# Patient Record
Sex: Female | Born: 1965 | Race: White | Hispanic: No | Smoking: Current every day smoker
Health system: Southern US, Community
[De-identification: ages and names within clinical notes are randomized; demographics above are authoritative.]

## PROBLEM LIST (undated history)

## (undated) DIAGNOSIS — F32A Depression, unspecified: Secondary | ICD-10-CM

## (undated) DIAGNOSIS — F419 Anxiety disorder, unspecified: Secondary | ICD-10-CM

## (undated) DIAGNOSIS — F329 Major depressive disorder, single episode, unspecified: Secondary | ICD-10-CM

## (undated) DIAGNOSIS — I671 Cerebral aneurysm, nonruptured: Secondary | ICD-10-CM

## (undated) HISTORY — DX: Anxiety disorder, unspecified: F41.9

## (undated) HISTORY — DX: Major depressive disorder, single episode, unspecified: F32.9

## (undated) HISTORY — DX: Cerebral aneurysm, nonruptured: I67.1

## (undated) HISTORY — DX: Depression, unspecified: F32.A

---

## 2011-12-15 DIAGNOSIS — I671 Cerebral aneurysm, nonruptured: Secondary | ICD-10-CM

## 2011-12-15 HISTORY — PX: CEREBRAL ANEURYSM REPAIR: SHX164

## 2011-12-15 HISTORY — DX: Cerebral aneurysm, nonruptured: I67.1

## 2014-06-20 ENCOUNTER — Telehealth: Payer: Self-pay | Admitting: Family Medicine

## 2014-06-20 NOTE — Telephone Encounter (Signed)
Patient has medicaid. Patient is on topamate, vistaril, celexa, trazadone, Ibuprofen as needed, patient just moved here in September. Patient is seeing [facility] on the 14th.  Patient states that she does not use any pain medications. Patient has used lorazepam in the past but has not used since September.  Appointment given for 07/20/14 with [facility].

## 2014-06-29 ENCOUNTER — Encounter (HOSPITAL_COMMUNITY): Payer: Self-pay | Admitting: Psychiatry

## 2014-06-29 ENCOUNTER — Encounter (INDEPENDENT_AMBULATORY_CARE_PROVIDER_SITE_OTHER): Payer: Self-pay

## 2014-06-29 ENCOUNTER — Ambulatory Visit (INDEPENDENT_AMBULATORY_CARE_PROVIDER_SITE_OTHER): Payer: Medicaid Other | Admitting: Psychiatry

## 2014-06-29 VITALS — BP 136/68 | HR 63 | Ht 66.0 in | Wt 179.0 lb

## 2014-06-29 DIAGNOSIS — F1021 Alcohol dependence, in remission: Secondary | ICD-10-CM

## 2014-06-29 DIAGNOSIS — F1490 Cocaine use, unspecified, uncomplicated: Secondary | ICD-10-CM

## 2014-06-29 DIAGNOSIS — F0630 Mood disorder due to known physiological condition, unspecified: Secondary | ICD-10-CM

## 2014-06-29 DIAGNOSIS — F39 Unspecified mood [affective] disorder: Secondary | ICD-10-CM

## 2014-06-29 DIAGNOSIS — F1421 Cocaine dependence, in remission: Secondary | ICD-10-CM

## 2014-06-29 DIAGNOSIS — F3130 Bipolar disorder, current episode depressed, mild or moderate severity, unspecified: Secondary | ICD-10-CM

## 2014-06-29 DIAGNOSIS — I671 Cerebral aneurysm, nonruptured: Secondary | ICD-10-CM | POA: Insufficient documentation

## 2014-06-29 DIAGNOSIS — F3162 Bipolar disorder, current episode mixed, moderate: Secondary | ICD-10-CM

## 2014-06-29 MED ORDER — TRAZODONE HCL 150 MG PO TABS: 75.0000 mg | ORAL_TABLET | Freq: Every day | ORAL | Status: DC

## 2014-06-29 MED ORDER — CITALOPRAM HYDROBROMIDE 20 MG PO TABS: 20.0000 mg | ORAL_TABLET | Freq: Every morning | ORAL | Status: DC

## 2014-06-29 MED ORDER — TOPIRAMATE 100 MG PO TABS: 150.0000 mg | ORAL_TABLET | Freq: Two times a day (BID) | ORAL | Status: DC

## 2014-06-29 MED ORDER — HYDROXYZINE PAMOATE 25 MG PO CAPS: 50.0000 mg | ORAL_CAPSULE | Freq: Two times a day (BID) | ORAL | Status: DC | PRN

## 2014-06-29 NOTE — Patient Instructions (Signed)
Support groups to attend Therapy. Avoid alcohol or other drugs
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
## 2014-07-20 ENCOUNTER — Encounter: Payer: Self-pay | Admitting: Family Medicine

## 2014-07-20 ENCOUNTER — Ambulatory Visit (INDEPENDENT_AMBULATORY_CARE_PROVIDER_SITE_OTHER): Payer: Medicaid Other | Admitting: Family Medicine

## 2014-07-20 VITALS — BP 123/66 | HR 88 | Temp 97.5°F | Ht 65.5 in | Wt 182.0 lb

## 2014-07-20 DIAGNOSIS — F3162 Bipolar disorder, current episode mixed, moderate: Secondary | ICD-10-CM | POA: Diagnosis not present

## 2014-07-20 DIAGNOSIS — I671 Cerebral aneurysm, nonruptured: Secondary | ICD-10-CM

## 2014-07-20 DIAGNOSIS — M2550 Pain in unspecified joint: Secondary | ICD-10-CM

## 2014-07-20 MED ORDER — VARENICLINE TARTRATE 0.5 MG X 11 & 1 MG X 42 PO MISC: ORAL | Status: DC

## 2014-07-20 NOTE — Progress Notes (Signed)
Subjective:    Patient ID: Tiffany Tyler, female    DOB: 07/01/1965, 49 y.o.   MRN: [id number]  HPI This is a new patient in today for the first time to establish care. She has a history of bipolar disease for which she has recently started seeing behavioral health the medications for that are as listed below citalopram hydroxyzine topiramate and trazodone. Today her primary concern is joint pain. This is widespread through many joints. She mentions pain and tenderness at the left elbow swelling at the hands and pain at the knees as well as lesser pains in other areas.  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 In 2013 she had a stent placed for that aneurysm however a new aneurysm behind the left eye was noted at that time on a scan. About that time she unfortunately went through the death of her 64 year old daughter through a heroin overdose. Unfortunately she was unable to pursue her own health care due to her despondency over the loss of her daughter. Today she is in for follow-up on the aneurysm as well. She is having headaches in the left temple and its associated with sensation of light in the eye. Patient states she has a tingling needlelike sensation all over body. Points to legs and the chest and arms. Notes bluish discoloration at the base of the thumbs.   Review of Systems  Constitutional: Negative for fever, chills, diaphoresis, appetite change, fatigue and unexpected weight change.  HENT: Negative for congestion, ear pain, hearing loss, postnasal drip, rhinorrhea, sneezing, sore throat and trouble swallowing.   Eyes: Negative for pain.  Respiratory: Negative for cough, chest tightness and shortness of breath.   Cardiovascular: Negative for chest pain and palpitations.  Gastrointestinal: Negative for nausea, vomiting, abdominal pain, diarrhea and constipation.  Genitourinary: Negative for dysuria,  frequency and menstrual problem.  Musculoskeletal: Negative for joint swelling and arthralgias.  Skin: Negative for rash.  Neurological:       See HPI  Psychiatric/Behavioral: Negative for dysphoric mood and agitation.       Objective:   Physical Exam  Constitutional: She is oriented to person, place, and time. She appears well-developed and well-nourished. No distress.  HENT:  Head: Normocephalic and atraumatic.  Right Ear: External ear normal.  Left Ear: External ear normal.  Nose: Nose normal.  Mouth/Throat: Oropharynx is clear and moist.  Eyes: Conjunctivae and EOM are normal. Pupils are equal, round, and reactive to light.  Neck: Normal range of motion. Neck supple. No thyromegaly present.  Cardiovascular: Normal rate, regular rhythm and normal heart sounds.   No murmur heard. Pulmonary/Chest: Effort normal and breath sounds normal. No respiratory distress. She has no wheezes. She has no rales.  Abdominal: Soft. Bowel sounds are normal. She exhibits no distension. There is no tenderness.  Lymphadenopathy:    She has no cervical adenopathy.  Neurological: She is alert and oriented to person, place, and time. She has normal reflexes.  Skin: Skin is warm and dry.  Psychiatric: She has a normal mood and affect. Her behavior is normal. Judgment and thought content normal.    BP 123/66 mmHg  Pulse 88  Temp(Src) 97.5 F (36.4 C) (Oral)  Ht 5' 5.5" (1.664 m)  Wt 182 lb (82.555 kg)  BMI 29.82 kg/m2  LMP 07/10/2014        Assessment & Plan:   1. Moderate mixed bipolar I  disorder   2. Brain aneurysm   3. Arthralgia     Meds ordered this encounter  Medications  . varenicline (CHANTIX STARTING MONTH PAK) 0.5 MG X 11 & 1 MG X 42 tablet    Sig: Take one 0.5 mg tablet by mouth once daily for 3 days, then increase to one 0.5 mg tablet twice daily for 4 days, then increase to one 1 mg tablet twice daily.    Dispense:  53 tablet    Refill:  0    Orders Placed This  Encounter  Procedures  . MR Brain W Wo Contrast    Standing Status: Future     Number of Occurrences:      Standing Expiration Date: 09/18/2015    Order Specific Question:  Reason for Exam (SYMPTOM  OR DIAGNOSIS REQUIRED)    Answer:  headaches and known brain aneurysm    Order Specific Question:  Preferred imaging location?    Answer:  [facility]    Order Specific Question:  Does the patient have a pacemaker or implanted devices?    Answer:  No    Order Specific Question:  What is the patient's sedation requirement?    Answer:  No Sedation  . CBC with Differential  . Sedimentation Rate  . RPR  . TSH  . ANA Comprehensive Panel  . Rheumatoid factor  . Comprehensive metabolic panel  . Lyme (B. burgdorferi) PCR  . HIV antibody (with reflex)    Labs pending Health Maintenance reviewed Diet and exercise encouraged Continue all meds as discussed Follow up in 1 mo.  Claretta Fraise, MD

## 2014-07-20 NOTE — Patient Instructions (Signed)
See mental health MD to consider further treatment options soon. Ask him regarding anonflict with using chantix. USe behavioral treatment such as suckers for the habit of hand to mouth activity involved with smoking.

## 2014-07-25 LAB — COMPREHENSIVE METABOLIC PANEL
ALBUMIN: 4.6 g/dL (ref 3.5–5.5)
ALT: 16 IU/L (ref 0–32)
AST: 24 IU/L (ref 0–40)
Albumin/Globulin Ratio: 2 (ref 1.1–2.5)
Alkaline Phosphatase: 65 IU/L (ref 39–117)
BILIRUBIN TOTAL: 0.4 mg/dL (ref 0.0–1.2)
BUN / CREAT RATIO: 11 (ref 9–23)
BUN: 10 mg/dL (ref 6–24)
CHLORIDE: 103 mmol/L (ref 97–108)
CO2: 21 mmol/L (ref 18–29)
Calcium: 9.7 mg/dL (ref 8.7–10.2)
Creatinine, Ser: 0.88 mg/dL (ref 0.57–1.00)
GFR calc Af Amer: 89 mL/min/{1.73_m2} (ref >59)
GFR calc non Af Amer: 77 mL/min/{1.73_m2} (ref >59)
Globulin, Total: 2.3 g/dL (ref 1.5–4.5)
Glucose: 93 mg/dL (ref 65–99)
Potassium: 4 mmol/L (ref 3.5–5.2)
Sodium: 139 mmol/L (ref 134–144)
Total Protein: 6.9 g/dL (ref 6.0–8.5)

## 2014-07-25 LAB — ANA COMPREHENSIVE PANEL
Centromere Ab Screen: <0.2 AI (ref 0.0–0.9)
Chromatin Ab SerPl-aCnc: <0.2 AI (ref 0.0–0.9)
ENA RNP Ab: 0.2 AI (ref 0.0–0.9)
ENA SSA (RO) Ab: <0.2 AI (ref 0.0–0.9)
ENA SSB (LA) Ab: <0.2 AI (ref 0.0–0.9)
dsDNA Ab: 1 IU/mL (ref 0–9)

## 2014-07-25 LAB — CBC WITH DIFFERENTIAL/PLATELET
Basophils Absolute: 0 10*3/uL (ref 0.0–0.2)
Basos: 1 %
EOS: 6 %
Eosinophils Absolute: 0.3 10*3/uL (ref 0.0–0.4)
HEMATOCRIT: 42.6 % (ref 34.0–46.6)
Hemoglobin: 14 g/dL (ref 11.1–15.9)
IMMATURE GRANULOCYTES: 0 %
Immature Grans (Abs): 0 10*3/uL (ref 0.0–0.1)
LYMPHS: 20 %
Lymphocytes Absolute: 1 10*3/uL (ref 0.7–3.1)
MCH: 30.4 pg (ref 26.6–33.0)
MCHC: 32.9 g/dL (ref 31.5–35.7)
MCV: 92 fL (ref 79–97)
MONOCYTES: 9 %
Monocytes Absolute: 0.5 10*3/uL (ref 0.1–0.9)
NEUTROS ABS: 3.3 10*3/uL (ref 1.4–7.0)
Neutrophils Relative %: 64 %
Platelets: 198 10*3/uL (ref 150–379)
RBC: 4.61 x10E6/uL (ref 3.77–5.28)
RDW: 13.5 % (ref 12.3–15.4)
WBC: 5.2 10*3/uL (ref 3.4–10.8)

## 2014-07-25 LAB — TSH: TSH: 0.759 u[IU]/mL (ref 0.450–4.500)

## 2014-07-25 LAB — RHEUMATOID FACTOR: Rhuematoid fact SerPl-aCnc: 10.7 IU/mL (ref 0.0–13.9)

## 2014-07-25 LAB — SEDIMENTATION RATE: Sed Rate: 4 mm/hr (ref 0–40)

## 2014-07-25 LAB — HIV ANTIBODY (ROUTINE TESTING W REFLEX): HIV Screen 4th Generation wRfx: NONREACTIVE

## 2014-07-25 LAB — LYME (B. BURGDORFERI) PCR: Lyme (B. burgdorferi) PCR: NEGATIVE

## 2014-07-25 LAB — RPR: RPR Ser Ql: NONREACTIVE

## 2014-07-26 ENCOUNTER — Ambulatory Visit (HOSPITAL_COMMUNITY): Payer: Medicaid Other | Admitting: Licensed Clinical Social Worker

## 2014-07-27 ENCOUNTER — Encounter: Payer: Self-pay | Admitting: Family Medicine

## 2014-07-31 ENCOUNTER — Ambulatory Visit (HOSPITAL_COMMUNITY): Payer: Medicaid Other | Admitting: Psychiatry

## 2014-07-31 ENCOUNTER — Other Ambulatory Visit (HOSPITAL_COMMUNITY): Payer: Self-pay | Admitting: Psychiatry

## 2014-08-01 ENCOUNTER — Ambulatory Visit: Payer: Medicaid Other | Admitting: Family Medicine

## 2014-08-03 ENCOUNTER — Encounter (HOSPITAL_COMMUNITY): Payer: Self-pay | Admitting: Psychiatry

## 2014-08-03 ENCOUNTER — Ambulatory Visit (INDEPENDENT_AMBULATORY_CARE_PROVIDER_SITE_OTHER): Payer: Medicaid Other | Admitting: Psychiatry

## 2014-08-03 ENCOUNTER — Encounter (INDEPENDENT_AMBULATORY_CARE_PROVIDER_SITE_OTHER): Payer: Self-pay

## 2014-08-03 VITALS — BP 145/99 | HR 83 | Ht 65.5 in | Wt 180.0 lb

## 2014-08-03 DIAGNOSIS — F0630 Mood disorder due to known physiological condition, unspecified: Secondary | ICD-10-CM

## 2014-08-03 DIAGNOSIS — F1021 Alcohol dependence, in remission: Secondary | ICD-10-CM

## 2014-08-03 DIAGNOSIS — F3162 Bipolar disorder, current episode mixed, moderate: Secondary | ICD-10-CM

## 2014-08-03 DIAGNOSIS — F1421 Cocaine dependence, in remission: Secondary | ICD-10-CM

## 2014-08-03 MED ORDER — TOPIRAMATE 100 MG PO TABS: 150.0000 mg | ORAL_TABLET | Freq: Two times a day (BID) | ORAL | Status: DC

## 2014-08-03 MED ORDER — ARIPIPRAZOLE 5 MG PO TABS: 5.0000 mg | ORAL_TABLET | Freq: Every day | ORAL | Status: DC

## 2014-08-03 MED ORDER — TRAZODONE HCL 50 MG PO TABS: 50.0000 mg | ORAL_TABLET | Freq: Every day | ORAL | Status: DC

## 2014-08-03 MED ORDER — HYDROXYZINE PAMOATE 25 MG PO CAPS: 25.0000 mg | ORAL_CAPSULE | Freq: Two times a day (BID) | ORAL | Status: DC | PRN

## 2014-08-03 MED ORDER — ARIPIPRAZOLE 10 MG PO TABS: 10.0000 mg | ORAL_TABLET | Freq: Every day | ORAL | Status: DC

## 2014-08-03 MED ORDER — CITALOPRAM HYDROBROMIDE 20 MG PO TABS: 20.0000 mg | ORAL_TABLET | Freq: Every morning | ORAL | Status: DC
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
## 2014-08-07 ENCOUNTER — Ambulatory Visit (INDEPENDENT_AMBULATORY_CARE_PROVIDER_SITE_OTHER): Payer: Medicaid Other | Admitting: Family Medicine

## 2014-08-11 ENCOUNTER — Ambulatory Visit (HOSPITAL_COMMUNITY)
Admission: RE | Admit: 2014-08-11 | Discharge: 2014-08-11 | Disposition: A | Discharge status: Home / Self Care | Payer: Medicaid Other | Source: Ambulatory Visit | Attending: Family Medicine | Admitting: Family Medicine

## 2014-08-11 DIAGNOSIS — I671 Cerebral aneurysm, nonruptured: Secondary | ICD-10-CM

## 2014-08-12 ENCOUNTER — Encounter (HOSPITAL_COMMUNITY): Payer: Self-pay | Admitting: Radiology

## 2014-08-15 ENCOUNTER — Ambulatory Visit (HOSPITAL_COMMUNITY)
Admission: RE | Admit: 2014-08-15 | Discharge: 2014-08-15 | Disposition: A | Discharge status: Home / Self Care | Payer: Medicaid Other | Source: Ambulatory Visit | Attending: Family Medicine | Admitting: Family Medicine

## 2014-08-17 ENCOUNTER — Other Ambulatory Visit (HOSPITAL_COMMUNITY): Payer: Self-pay | Admitting: Psychiatry

## 2014-08-21 ENCOUNTER — Ambulatory Visit: Payer: Medicaid Other | Admitting: Family Medicine

## 2014-08-24 ENCOUNTER — Other Ambulatory Visit: Payer: Medicaid Other | Admitting: Family

## 2014-08-30 ENCOUNTER — Ambulatory Visit (INDEPENDENT_AMBULATORY_CARE_PROVIDER_SITE_OTHER): Payer: MEDICAID | Admitting: Licensed Clinical Social Worker

## 2014-08-30 ENCOUNTER — Encounter (INDEPENDENT_AMBULATORY_CARE_PROVIDER_SITE_OTHER): Payer: Self-pay

## 2014-08-30 DIAGNOSIS — F329 Major depressive disorder, single episode, unspecified: Secondary | ICD-10-CM

## 2014-08-30 DIAGNOSIS — F3162 Bipolar disorder, current episode mixed, moderate: Secondary | ICD-10-CM
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
## 2014-09-01 ENCOUNTER — Ambulatory Visit (HOSPITAL_COMMUNITY): Payer: Medicaid Other | Admitting: Psychiatry

## 2014-09-11 ENCOUNTER — Telehealth: Payer: Self-pay | Admitting: Family Medicine

## 2014-09-11 NOTE — Telephone Encounter (Signed)
I would suggest giving her the phone number of the MRI place since the referrals already been made and having her reschedule for her convenience.

## 2014-09-11 NOTE — Telephone Encounter (Signed)
Please see attached note from patient  She had two appts for this test

## 2014-09-12 ENCOUNTER — Other Ambulatory Visit: Payer: Self-pay

## 2014-09-12 DIAGNOSIS — I729 Aneurysm of unspecified site: Secondary | ICD-10-CM

## 2014-09-13 ENCOUNTER — Ambulatory Visit (HOSPITAL_COMMUNITY): Payer: Self-pay | Admitting: Licensed Clinical Social Worker

## 2014-09-26 ENCOUNTER — Ambulatory Visit (HOSPITAL_COMMUNITY)
Admission: RE | Admit: 2014-09-26 | Discharge: 2014-09-26 | Disposition: A | Discharge status: Home / Self Care | Payer: Medicaid Other | Source: Ambulatory Visit | Attending: Family Medicine | Admitting: Family Medicine

## 2014-09-26 DIAGNOSIS — I729 Aneurysm of unspecified site: Secondary | ICD-10-CM

## 2014-09-26 DIAGNOSIS — H538 Other visual disturbances: Secondary | ICD-10-CM | POA: Insufficient documentation

## 2014-09-26 DIAGNOSIS — I671 Cerebral aneurysm, nonruptured: Secondary | ICD-10-CM | POA: Diagnosis not present

## 2014-09-26 MED ORDER — GADOBENATE DIMEGLUMINE 529 MG/ML IV SOLN: 15.0000 mL | Freq: Once | INTRAVENOUS | Status: AC | PRN

## 2014-09-27 ENCOUNTER — Telehealth: Payer: Self-pay | Admitting: Family Medicine

## 2014-09-28 ENCOUNTER — Ambulatory Visit (INDEPENDENT_AMBULATORY_CARE_PROVIDER_SITE_OTHER): Payer: MEDICAID | Admitting: Psychiatry

## 2014-09-28 ENCOUNTER — Encounter (HOSPITAL_COMMUNITY): Payer: Self-pay | Admitting: Psychiatry

## 2014-09-28 VITALS — HR 80 | Ht 65.0 in | Wt 180.0 lb

## 2014-09-28 DIAGNOSIS — F1021 Alcohol dependence, in remission: Secondary | ICD-10-CM

## 2014-09-28 DIAGNOSIS — F0630 Mood disorder due to known physiological condition, unspecified: Secondary | ICD-10-CM

## 2014-09-28 DIAGNOSIS — F39 Unspecified mood [affective] disorder: Secondary | ICD-10-CM

## 2014-09-28 DIAGNOSIS — F1421 Cocaine dependence, in remission: Secondary | ICD-10-CM | POA: Diagnosis not present

## 2014-09-28 DIAGNOSIS — F3162 Bipolar disorder, current episode mixed, moderate: Secondary | ICD-10-CM

## 2014-09-28 DIAGNOSIS — F3130 Bipolar disorder, current episode depressed, mild or moderate severity, unspecified: Secondary | ICD-10-CM | POA: Diagnosis not present

## 2014-09-28 MED ORDER — HYDROXYZINE PAMOATE 25 MG PO CAPS: 25.0000 mg | ORAL_CAPSULE | Freq: Two times a day (BID) | ORAL | Status: DC | PRN

## 2014-09-28 MED ORDER — CITALOPRAM HYDROBROMIDE 20 MG PO TABS: 20.0000 mg | ORAL_TABLET | Freq: Every morning | ORAL | Status: DC

## 2014-09-28 MED ORDER — TOPIRAMATE 100 MG PO TABS: 150.0000 mg | ORAL_TABLET | Freq: Two times a day (BID) | ORAL | Status: DC
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
## 2014-09-29 ENCOUNTER — Encounter: Payer: Self-pay | Admitting: Family Medicine

## 2014-09-29 ENCOUNTER — Ambulatory Visit (INDEPENDENT_AMBULATORY_CARE_PROVIDER_SITE_OTHER): Payer: Medicaid Other | Admitting: Family Medicine

## 2014-09-29 VITALS — BP 121/77 | HR 64 | Temp 97.5°F | Ht 65.0 in | Wt 181.2 lb

## 2014-09-29 DIAGNOSIS — M79601 Pain in right arm: Secondary | ICD-10-CM | POA: Diagnosis not present

## 2014-09-29 NOTE — Progress Notes (Signed)
Subjective:  Patient ID: Tiffany Tyler, female    DOB: [birth year]  Age: 49 y.o. MRN: [id number]  CC: Bleeding/Bruising   HPI Tiffany Tyler presents for an eruption on the right forearm that is somewhat reddish and purpuric that sprung up from no known injury a few days ago and has not resolved. There is no pain associated. There is no significant swelling. Patient is concerned that this could represent a blood clot. She has history of a brain aneurysm and is concerned this could represent a similar situation peripherally. Her mood disorder seems well controlled with citalopram currently. Says trazodone is helping with sleep and she needs to have that refilled. History Tiffany Tyler has a past medical history of Anxiety; Depression; and Aneurysm of anterior communicating artery (12/2011).   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   Her family history includes Alcohol abuse in her brother and maternal grandmother.She reports that she has been smoking.  She does not have any smokeless tobacco history on file. She reports that she does not drink alcohol or use illicit drugs.  Current Outpatient Prescriptions on File Prior to Visit  Medication Sig Dispense Refill  . citalopram (CELEXA) 20 MG tablet Take 1 tablet (20 mg total) by mouth every morning. 30 tablet 1  . hydrOXYzine (VISTARIL) 25 MG capsule Take 1 capsule (25 mg total) by mouth 2 (two) times daily as needed for anxiety. 30 capsule 0  . topiramate (TOPAMAX) 100 MG tablet Take 1.5 tablets (150 mg total) by mouth 2 (two) times daily. 60 tablet 1  . [DISCONTINUED] ARIPiprazole (ABILIFY) 5 MG tablet Take 1 tablet (5 mg total) by mouth daily. 30 tablet 0   No current facility-administered medications on file prior to visit.    ROS Review of Systems  Constitutional: Negative for fever, chills, diaphoresis, appetite change, fatigue and unexpected weight change.  HENT:  Negative for congestion, ear pain, hearing loss, postnasal drip, rhinorrhea, sneezing, sore throat and trouble swallowing.   Eyes: Negative for pain.  Respiratory: Negative for cough, chest tightness and shortness of breath.   Cardiovascular: Negative for chest pain and palpitations.  Gastrointestinal: Negative for nausea, vomiting, abdominal pain, diarrhea and constipation.  Genitourinary: Negative for dysuria, frequency and menstrual problem.  Musculoskeletal: Negative for joint swelling and arthralgias.  Skin: Negative for rash.  Neurological: Negative for dizziness, weakness, numbness and headaches.  Psychiatric/Behavioral: Negative for dysphoric mood and agitation.    Objective:  BP 121/77 mmHg  Pulse 64  Temp(Src) 97.5 F (36.4 C) (Oral)  Ht 5\' 5"  (1.651 m)  Wt 181 lb 3.2 oz (82.192 kg)  BMI 30.15 kg/m2  LMP 09/20/2014  BP Readings from Last 3 Encounters:  09/29/14 121/77  08/03/14 145/99  07/20/14 123/66    Wt Readings from Last 3 Encounters:  09/29/14 181 lb 3.2 oz (82.192 kg)  09/28/14 180 lb (81.647 kg)  08/03/14 180 lb (81.647 kg)     Physical Exam  Constitutional: She is oriented to person, place, and time. She appears well-developed and well-nourished. No distress.  HENT:  Head: Normocephalic and atraumatic.  Right Ear: External ear normal.  Left Ear: External ear normal.  Nose: Nose normal.  Mouth/Throat: Oropharynx is clear and moist.  Eyes: Conjunctivae and EOM are normal. Pupils are equal, round, and reactive to light.  Neck: Normal range of motion. Neck supple. No thyromegaly present.  Cardiovascular: Normal rate, regular rhythm and normal heart sounds.   No murmur heard. Pulmonary/Chest: Effort  normal and breath sounds normal. No respiratory distress. She has no wheezes. She has no rales.  Abdominal: Soft. Bowel sounds are normal. She exhibits no distension. There is no tenderness.  Lymphadenopathy:    She has no cervical adenopathy.    Neurological: She is alert and oriented to person, place, and time. She has normal reflexes.  Skin: Skin is warm and dry.  Psychiatric: She has a normal mood and affect. Her behavior is normal. Judgment and thought content normal.    No results found for: HGBA1C  Lab Results  Component Value Date   WBC 5.2 07/20/2014   HGB 14.0 07/20/2014   HCT 42.6 07/20/2014   PLT 198 07/20/2014   GLUCOSE 93 07/20/2014   ALT 16 07/20/2014   AST 24 07/20/2014   NA 139 07/20/2014   K 4.0 07/20/2014   CL 103 07/20/2014   CREATININE 0.88 07/20/2014   BUN 10 07/20/2014   CO2 21 07/20/2014   TSH 0.759 07/20/2014    Mr Brain W Wo Contrast  09/26/2014   CLINICAL DATA:  History of aneurysm.  Blurred vision.  EXAM: MRI HEAD WITHOUT AND WITH CONTRAST  TECHNIQUE: Multiplanar, multiecho pulse sequences of the brain and surrounding structures were obtained without and with intravenous contrast.  CONTRAST:  15 mL MultiHance IV  COMPARISON:  None.  FINDINGS: Ventricle size is normal. Cerebral volume is normal. Pituitary normal in size. Craniocervical junction normal.  Negative for acute ischemia. Patchy hyperintensity in the deep white matter of both cerebral hemispheres most consistent with chronic microvascular ischemia. No cortical infarct. Brainstem and cerebellum normal.  Negative for intracranial hemorrhage  Negative for mass or edema.  Normal enhancement following contrast infusion  History of cerebral aneurysm repair. No prior CT or records are available to determine the site of prior aneurysm. If there is concern of recurrent aneurysm, MRA or CTA recommended for further evaluation  Mucosal edema in the paranasal sinuses bilaterally. Right mastoid sinus effusion.  IMPRESSION: Chronic microvascular ischemic change in the cerebral white matter bilaterally. No acute abnormality.  History of prior aneurysm repair, not well visualized on MRI.   Electronically Signed   By: Franchot Gallo M.D.   On: 09/26/2014 14:12     Assessment & Plan:   Tiffany Tyler was seen today for bleeding/bruising.  Diagnoses and all orders for this visit:  Pain, arm, right Orders: -     Upper extremity Venous Duplex Right; Future  I have discontinued Tiffany Tyler's varenicline. I am also having her maintain her hydrOXYzine, citalopram, topiramate, and traZODone.  Meds ordered this encounter  Medications  . traZODone (DESYREL) 150 MG tablet    Sig: Take 1 tablet by mouth at bedtime as needed.    Refill:  0     Follow-up: Return in about 3 months (around 12/29/2014).  Claretta Fraise, M.D.

## 2014-10-09 ENCOUNTER — Other Ambulatory Visit: Payer: Medicaid Other | Admitting: Family

## 2014-10-30 ENCOUNTER — Ambulatory Visit (INDEPENDENT_AMBULATORY_CARE_PROVIDER_SITE_OTHER): Payer: MEDICAID | Admitting: Licensed Clinical Social Worker

## 2014-10-30 ENCOUNTER — Encounter (HOSPITAL_COMMUNITY): Payer: Self-pay | Admitting: *Deleted

## 2014-10-30 DIAGNOSIS — F319 Bipolar disorder, unspecified: Secondary | ICD-10-CM
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
## 2014-11-09 ENCOUNTER — Ambulatory Visit (INDEPENDENT_AMBULATORY_CARE_PROVIDER_SITE_OTHER): Payer: MEDICAID | Admitting: Licensed Clinical Social Worker

## 2014-11-09 DIAGNOSIS — F319 Bipolar disorder, unspecified: Secondary | ICD-10-CM

## 2014-11-10 DIAGNOSIS — F319 Bipolar disorder, unspecified: Secondary | ICD-10-CM | POA: Insufficient documentation
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
## 2014-11-15 ENCOUNTER — Telehealth: Payer: Self-pay

## 2014-11-15 NOTE — Telephone Encounter (Signed)
Suppose to get a referral for colonoscopy

## 2014-11-16 NOTE — Telephone Encounter (Signed)
Please refer as requested Thanks, WS 

## 2014-11-16 NOTE — Telephone Encounter (Signed)
Please refer is requested. Thanks, WS.

## 2014-11-17 ENCOUNTER — Ambulatory Visit (HOSPITAL_COMMUNITY): Payer: Self-pay | Admitting: Licensed Clinical Social Worker

## 2014-11-28 ENCOUNTER — Ambulatory Visit (INDEPENDENT_AMBULATORY_CARE_PROVIDER_SITE_OTHER): Payer: MEDICAID | Admitting: Psychiatry

## 2014-11-28 ENCOUNTER — Encounter (HOSPITAL_COMMUNITY): Payer: Self-pay | Admitting: Psychiatry

## 2014-11-28 VITALS — BP 126/70 | HR 60 | Ht 65.0 in | Wt 178.0 lb

## 2014-11-28 DIAGNOSIS — F1421 Cocaine dependence, in remission: Secondary | ICD-10-CM

## 2014-11-28 DIAGNOSIS — F3130 Bipolar disorder, current episode depressed, mild or moderate severity, unspecified: Secondary | ICD-10-CM

## 2014-11-28 DIAGNOSIS — F39 Unspecified mood [affective] disorder: Secondary | ICD-10-CM

## 2014-11-28 DIAGNOSIS — F1420 Cocaine dependence, uncomplicated: Secondary | ICD-10-CM | POA: Diagnosis not present

## 2014-11-28 DIAGNOSIS — F0630 Mood disorder due to known physiological condition, unspecified: Secondary | ICD-10-CM

## 2014-11-28 DIAGNOSIS — F1021 Alcohol dependence, in remission: Secondary | ICD-10-CM

## 2014-11-28 DIAGNOSIS — F319 Bipolar disorder, unspecified: Secondary | ICD-10-CM

## 2014-11-28 MED ORDER — TOPIRAMATE 100 MG PO TABS: 150.0000 mg | ORAL_TABLET | Freq: Two times a day (BID) | ORAL | Status: DC

## 2014-11-28 MED ORDER — CITALOPRAM HYDROBROMIDE 20 MG PO TABS: 20.0000 mg | ORAL_TABLET | Freq: Every morning | ORAL | Status: DC

## 2014-11-28 NOTE — Progress Notes (Signed)
Patient ID: Tiffany Tyler, female   DOB: September 19, 1965, 49 y.o.   MRN: [id number]  [name] Follow up Outpatient Visit  Tiffany Tyler [id number] 49 y.o.  11/28/2014 10:27 AM  Chief Complaint:  Medication management for bipolar. I had treatment. Recent relocation stressful.  History of Present Illness:   Patient Presents for follow up and medication management for Bipolar disorder and history of substance abuse.  Says that 2 years ago her daughter died of overdose on heroin since then she started regressing back on alcohol and has had some mood swings and uncertain episodes about depression having feelings of withdrawn, a motivation decrease in energy decreased sleep. She was feeling hopeless and helpless her psychiatrist started on Celexa at that time and that has balanced her out somewhat. She is not endorsing hopelessness helplessness or having any suicidal thoughts.  Last visit we stopped trazodone she is not taking Abilify. She kept taking Topamax that is helping her mood swings. She also is on Celexa for depression and anxiety. Recently she has lead to a DUI charge and she may have to do community service for 120 hours in the next 1 year. She is upset about it but says that she had to take a charge and she had a 78-year-old daughter sitting in the car and want to make the right decision here onwards.  Does not endorse significant craving for alcohol she attends church groups and is trying to rehabilitate and has been sober.  Modiyfing factors; her family  In regarding to her substance abuse she has remained sober off cocaine for the last 5 years sober off alcohol for the last 4 months. October 2015 she fell off the wagon had an accident but since then again sober.  At time she feels guilt about her daughter is dying of a heroin overdose but she is now realizing it is not her fault and that keeps her going moving forward. She is involved in church group and read  books and also that [place] books  There is no clear manic symptoms as now there is no paranoia or hallucinations or delusions as of now.  Does not endorse having panic like symptoms she does agree Mr. does help her anxiety and that her stress level goes up.    Hospitalization for psychiatric illness: Yes  Prior Suicide Attempts: No  Medical History; Past Medical History  Diagnosis Date  . Anxiety   . Depression   . 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 Lamotrigine Rash    Medications: Outpatient Encounter Prescriptions as of 11/28/2014  Medication Sig  . citalopram (CELEXA) 20 MG tablet Take 1 tablet (20 mg total) by mouth every morning.  . topiramate (TOPAMAX) 100 MG tablet Take 1.5 tablets (150 mg total) by mouth 2 (two) times daily.  . [DISCONTINUED] citalopram (CELEXA) 20 MG tablet Take 1 tablet (20 mg total) by mouth every morning.  . [DISCONTINUED] hydrOXYzine (VISTARIL) 25 MG capsule Take 1 capsule (25 mg total) by mouth 2 (two) times daily as needed for anxiety.  . [DISCONTINUED] topiramate (TOPAMAX) 100 MG tablet Take 1.5 tablets (150 mg total) by mouth 2 (two) times daily.  . [DISCONTINUED] traZODone (DESYREL) 150 MG tablet Take 1 tablet by mouth at bedtime as needed.   No facility-administered encounter medications on file as of 11/28/2014.     Substance Abuse History: Extensive history of using cocaine and alcohol.  She started smoking alcohol for the partner and that lasted for long she has had gone through multiple rehabs before last use of cocaine has been 5-6 years ago. No cravings she still has a sponsor and follows up with the church program   Alcohol she started drinking again 2 years ago when her daughter died. As of now she is not drinking sober for the last 3 months now significant craving. She follows up with her church program and also agrees to support  group pokes  Family History; Family History  Problem Relation Age of Onset  . Alcohol abuse Brother   . Alcohol abuse Maternal Grandmother         Musculoskeletal: Strength & Muscle Tone: within normal limits Gait & Station: normal Patient leans: N/A  Mental Status Examination;   Psychiatric Specialty Exam: Physical Exam  Constitutional: She appears well-developed and well-nourished. No distress.  HENT:  Head: Normocephalic and atraumatic.  Skin: She is not diaphoretic.    Review of Systems  Constitutional: Negative for fever.  Cardiovascular: Negative for chest pain.  Gastrointestinal: Negative for nausea.  Skin: Negative for rash.  Neurological: Negative for headaches.  Psychiatric/Behavioral: Negative for suicidal ideas and substance abuse. The patient is nervous/anxious.     Blood pressure 126/70, pulse 60, height 5\' 5"  (1.651 m), weight 178 lb (80.74 kg), SpO2 99 %.Body mass index is 29.62 kg/(m^2).  General Appearance: Casual  Eye Contact::  Fair  Speech:  Normal Rate  Volume:  Increased  Mood:  euthymic  Affect:  Congruent  Thought Process:  Coherent  Orientation:  Full (Time, Place, and Person)  Thought Content:  Rumination  Suicidal Thoughts:  No  Homicidal Thoughts:  No  Memory:  Immediate;   Fair Recent;   Fair  Judgement:  Fair  Insight:  Shallow  Psychomotor Activity:  Normal  Concentration:  Fair  Recall:  Fair  Akathisia:  Negative  Handed:  Right  AIMS (if indicated):     Assets:  Communication Skills Desire for Improvement Financial Resources/Insurance Physical Health Transportation Vocational/Educational  Sleep:        Assessment: Axis I: Bipolar disorder I, depressed type. 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 Mood disorder unspecified  Axis II: Deferred  Axis III:  Past Medical History  Diagnosis Date  . Anxiety   . Depression   . Aneurysm of anterior communicating artery  12/2011    embolization info scanned in 07/2014  Brain aneurysm  Axis IV: moderate, psycosocial. Relationship. Recent relocation. Doctors that   Treatment Plan and Summary:    Continue Celexa 20 mg continue Topamax 300 mg. For depression and bipolar.   Has remained sober off cocaine for many years now.  Continue attend AA groups and church groups for Alcohol use disorder   Pertinent Labs and Relevant Prior Notes reviewed. Medication Side effects, benefits and risks reviewed/discussed with Patient. Time given for patient to respond and asks questions regarding the Diagnosis and Medications. Safety concerns and to report to ER if suicidal or call 911. Relevant Medications refilled or called in to pharmacy.  Follow up with Primary care provider in regards to Medical conditions. Recommend compliance with medications and follow up office appointments. Discussed to avail opportunity to consider or/and continue Individual therapy with Counselor. Greater than 50% of time was spend in counseling and coordination of care with the patient.  Schedule for Follow up visit in 6 weeks  or call in earlier as necessary.  Merian Capron, MD 11/28/2014

## 2014-12-07 ENCOUNTER — Ambulatory Visit (INDEPENDENT_AMBULATORY_CARE_PROVIDER_SITE_OTHER): Payer: MEDICAID | Admitting: Licensed Clinical Social Worker

## 2014-12-07 DIAGNOSIS — F319 Bipolar disorder, unspecified: Secondary | ICD-10-CM | POA: Diagnosis not present

## 2014-12-07 NOTE — Progress Notes (Signed)
THERAPIST PROGRESS NOTE  Session Time: 1:00pm-2:00pm  Participation Level: Active  Behavioral Response: Casual Alert Anxious, Labile  Type of Therapy: Individual Therapy  Treatment Goals addressed: improve decision making skills  Interventions: Setting boundaries in relationships  Suicidal/Homicidal: Denied both  Therapist Interventions:  Helped patient consider pros and cons of working on her relationship with her husband.  Expressed concern that doing so would make it more difficult for her to focus on her own self-care.  Provided suggestions for how she can be supportive of her husband getting help for his own mental health issues, but make it clear to him that she is not going to "save him."        Summary:  Indicated that she has been struggling to make decisions lately.  Says she would like to figure out a way to serve her 120 day sentence without interrupting the positive ways she is working on her recovery.  She wants to continue going to church, work, go to school, and participate in therapy.   Indicated she is unsure about what to do about her relationship with her husband.  Reports he has started going to therapy and she has observed positive changes in the way he is expressing his thoughts and feelings.  Wants to believe that he is capable of change.      Plan: May introduce mindfulness at next appointment.   Diagnosis:Bipolar I disorder unspecified

## 2014-12-22 ENCOUNTER — Ambulatory Visit (HOSPITAL_COMMUNITY): Payer: Self-pay | Admitting: Licensed Clinical Social Worker

## 2015-01-05 ENCOUNTER — Ambulatory Visit (HOSPITAL_COMMUNITY): Payer: Self-pay | Admitting: Licensed Clinical Social Worker

## 2015-01-18 ENCOUNTER — Ambulatory Visit (HOSPITAL_COMMUNITY): Payer: Self-pay | Admitting: Licensed Clinical Social Worker

## 2015-01-24 ENCOUNTER — Ambulatory Visit (HOSPITAL_COMMUNITY): Payer: Self-pay | Admitting: Psychiatry

## 2015-02-13 ENCOUNTER — Other Ambulatory Visit (HOSPITAL_COMMUNITY): Payer: Self-pay | Admitting: Psychiatry

## 2015-02-15 NOTE — Telephone Encounter (Signed)
Received medication request from [facility] for Celexa 20mg . Per Dr. De Nurse, pt medication request for Celexa 20mg  is denied. Pt was dismissed from clinic on 01/24/15 due to no show policy.

## 2015-02-27 ENCOUNTER — Ambulatory Visit (INDEPENDENT_AMBULATORY_CARE_PROVIDER_SITE_OTHER): Payer: Medicaid Other

## 2015-02-27 ENCOUNTER — Encounter: Payer: Self-pay | Admitting: Nurse Practitioner

## 2015-02-27 ENCOUNTER — Ambulatory Visit (INDEPENDENT_AMBULATORY_CARE_PROVIDER_SITE_OTHER): Payer: Medicaid Other | Admitting: Nurse Practitioner

## 2015-02-27 VITALS — BP 127/77 | HR 57 | Temp 96.7°F | Ht 65.0 in | Wt 175.0 lb

## 2015-02-27 DIAGNOSIS — K219 Gastro-esophageal reflux disease without esophagitis: Secondary | ICD-10-CM | POA: Diagnosis not present

## 2015-02-27 DIAGNOSIS — F329 Major depressive disorder, single episode, unspecified: Secondary | ICD-10-CM

## 2015-02-27 DIAGNOSIS — R1013 Epigastric pain: Secondary | ICD-10-CM | POA: Diagnosis not present

## 2015-02-27 DIAGNOSIS — F32A Depression, unspecified: Secondary | ICD-10-CM

## 2015-02-27 DIAGNOSIS — K5900 Constipation, unspecified: Secondary | ICD-10-CM

## 2015-02-27 MED ORDER — OMEPRAZOLE 20 MG PO CPDR: 20.0000 mg | DELAYED_RELEASE_CAPSULE | Freq: Every day | ORAL | Status: DC

## 2015-02-27 MED ORDER — CITALOPRAM HYDROBROMIDE 40 MG PO TABS: 40.0000 mg | ORAL_TABLET | Freq: Every day | ORAL | Status: DC

## 2015-02-27 NOTE — Progress Notes (Signed)
   Subjective:    Patient ID: Tiffany Tyler, female    DOB: 03-27-1966, 49 y.o.   MRN: [id number]  HPI Patient in today c/o: - Has been nauseated for last 2 weeks with scattered abdominal pain- She wakes up feeling this way and worsens after she eats. She also started her menses but usually does not cause problems with her stomach. - Says that this abdominal problems has caused depression. See depression screen. She is currently on celexa and topamax and has been on this combination for awhile.    Review of Systems  Constitutional: Negative.   HENT: Negative.   Respiratory: Negative.   Cardiovascular: Negative.   Gastrointestinal: Positive for nausea, abdominal pain and diarrhea (but that has stopped). Negative for constipation.  Genitourinary: Positive for pelvic pain. Negative for dysuria, urgency and frequency.  Neurological: Negative.   Psychiatric/Behavioral: Negative.   All other systems reviewed and are negative.      Objective:   Physical Exam  Constitutional: She is oriented to person, place, and time. She appears well-developed and well-nourished.  Cardiovascular: Normal rate, regular rhythm and normal heart sounds.   Pulmonary/Chest: Effort normal and breath sounds normal.  Abdominal: Soft. Bowel sounds are normal. She exhibits no distension and no mass. There is tenderness (mid epigastric pain on palpation). There is no rebound and no guarding.  Neurological: She is alert and oriented to person, place, and time.  Skin: Skin is warm and dry.  Psychiatric: She has a normal mood and affect. Her behavior is normal. Judgment and thought content normal.    BP 127/77 mmHg  Pulse 57  Temp(Src) 96.7 F (35.9 C) (Oral)  Ht 5\' 5"  (1.651 m)  Wt 175 lb (79.379 kg)  BMI 29.12 kg/m2  KUB- moderate stool burden-Preliminary reading by Ronnald Collum, FNP  [facility]       Assessment & Plan:  1. Epigastric pain - DG Abd 1 View; Future  2. Constipation, unspecified  constipation type miralax daily  Increase fiber in diet  3. Gastroesophageal reflux disease without esophagitis Avoid spicy foods Do not eat 2 hours prior to bedtime\ - omeprazole (PRILOSEC) 20 MG capsule; Take 1 capsule (20 mg total) by mouth daily.  Dispense: 30 capsule; Refill: 3  4. Depression Stress management Continue counseling - citalopram (CELEXA) 40 MG tablet; Take 1 tablet (40 mg total) by mouth daily.  Dispense: 30 tablet; Refill: [place], FNP

## 2015-02-27 NOTE — Patient Instructions (Signed)

## 2015-05-21 ENCOUNTER — Telehealth: Payer: Self-pay | Admitting: Family Medicine

## 2015-05-23 ENCOUNTER — Ambulatory Visit: Payer: Medicaid Other

## 2015-05-24 ENCOUNTER — Encounter: Payer: Self-pay | Admitting: Family Medicine

## 2015-05-24 ENCOUNTER — Ambulatory Visit (INDEPENDENT_AMBULATORY_CARE_PROVIDER_SITE_OTHER): Payer: Medicaid Other | Admitting: Family Medicine

## 2015-05-24 VITALS — BP 117/71 | HR 72 | Temp 99.2°F | Ht 65.0 in | Wt 173.0 lb

## 2015-05-24 DIAGNOSIS — H6591 Unspecified nonsuppurative otitis media, right ear: Secondary | ICD-10-CM

## 2015-05-24 DIAGNOSIS — Z1211 Encounter for screening for malignant neoplasm of colon: Secondary | ICD-10-CM | POA: Diagnosis not present

## 2015-05-24 MED ORDER — FLUTICASONE PROPIONATE 50 MCG/ACT NA SUSP: 1.0000 | Freq: Two times a day (BID) | NASAL | Status: DC | PRN

## 2015-05-24 MED ORDER — AZITHROMYCIN 250 MG PO TABS: ORAL_TABLET | ORAL | Status: DC

## 2015-05-24 NOTE — Progress Notes (Signed)
BP 117/71 mmHg  Pulse 72  Temp(Src) 99.2 F (37.3 C) (Oral)  Ht 5\' 5"  (1.651 m)  Wt 173 lb (78.472 kg)  BMI 28.79 kg/m2  LMP  (Approximate)   Subjective:    Patient ID: Tiffany Tyler, female    DOB: May 08, 1966, 49 y.o.   MRN: [id number]  HPI: Tiffany Tyler is a 49 y.o. female presenting on 05/24/2015 for Cough; Right ear pain; and Chest congestion   HPI Right ear pain and nasal congestion Patient is been having right ear pain and nasal congestion and sinus pressure and postnasal drainage along with a dry cough for the past 3 days. She denies fevers or chills. She does admit that she has started back up smoking. Her right ear has also been popping along with the pain. There is no drainage from her right ear. She denies any sick contacts. The pain radiates down into her right jaw as well. She has tried some over-the-counter Tylenol and an antihistamine.  Relevant past medical, surgical, family and social history reviewed and updated as indicated. Interim medical history since our last visit reviewed. Allergies and medications reviewed and updated.  Review of Systems  Constitutional: Negative for fever and chills.  HENT: Positive for congestion, ear pain, postnasal drip, rhinorrhea, sinus pressure and sore throat. Negative for ear discharge and sneezing.   Eyes: Negative for pain, redness and visual disturbance.  Respiratory: Positive for cough. Negative for chest tightness and shortness of breath.   Cardiovascular: Negative for chest pain and leg swelling.  Genitourinary: Negative for dysuria and difficulty urinating.  Musculoskeletal: Negative for back pain and gait problem.  Skin: Negative for rash.  Neurological: Negative for light-headedness and headaches.  Psychiatric/Behavioral: Negative for behavioral problems and agitation.  All other systems reviewed and are negative.   Per HPI unless specifically indicated above     Medication List       This list is  accurate as of: 05/24/15  2:58 PM.  Always use your most recent med list.               azithromycin 250 MG tablet  Commonly known as:  ZITHROMAX  Take 2 the first day and then one each day after.     citalopram 40 MG tablet  Commonly known as:  CELEXA  Take 1 tablet (40 mg total) by mouth daily.     ferrous sulfate 325 (65 FE) MG tablet  Take 325 mg by mouth daily with breakfast.     fluticasone 50 MCG/ACT nasal spray  Commonly known as:  FLONASE  Place 1 spray into both nostrils 2 (two) times daily as needed for allergies or rhinitis.     omeprazole 20 MG capsule  Commonly known as:  PRILOSEC  Take 1 capsule (20 mg total) by mouth daily.     topiramate 100 MG tablet  Commonly known as:  TOPAMAX  Take 1.5 tablets (150 mg total) by mouth 2 (two) times daily.           Objective:    BP 117/71 mmHg  Pulse 72  Temp(Src) 99.2 F (37.3 C) (Oral)  Ht 5\' 5"  (1.651 m)  Wt 173 lb (78.472 kg)  BMI 28.79 kg/m2  LMP  (Approximate)  Wt Readings from Last 3 Encounters:  05/24/15 173 lb (78.472 kg)  02/27/15 175 lb (79.379 kg)  11/28/14 178 lb (80.74 kg)    Physical Exam  Constitutional: She is oriented to person, place, and time. She appears well-developed and  well-nourished. No distress.  HENT:  Right Ear: External ear and ear canal normal. No mastoid tenderness. Tympanic membrane is injected, erythematous and bulging. Tympanic membrane is not scarred and not perforated. A middle ear effusion is present.  Left Ear: Tympanic membrane, external ear and ear canal normal.  Nose: Mucosal edema and rhinorrhea present. No epistaxis. Right sinus exhibits no maxillary sinus tenderness and no frontal sinus tenderness. Left sinus exhibits no maxillary sinus tenderness and no frontal sinus tenderness.  Mouth/Throat: Uvula is midline and mucous membranes are normal. Posterior oropharyngeal edema and posterior oropharyngeal erythema present. No oropharyngeal exudate or tonsillar abscesses.    Eyes: Conjunctivae and EOM are normal.  Cardiovascular: Normal rate, regular rhythm, normal heart sounds and intact distal pulses.   No murmur heard. Pulmonary/Chest: Effort normal and breath sounds normal. No respiratory distress. She has no wheezes.  Musculoskeletal: Normal range of motion. She exhibits no edema or tenderness.  Neurological: She is alert and oriented to person, place, and time. Coordination normal.  Skin: Skin is warm and dry. No rash noted. She is not diaphoretic.  Psychiatric: She has a normal mood and affect. Her behavior is normal.  Vitals reviewed.   Results for orders placed or performed in visit on 07/20/14  CBC with Differential  Result Value Ref Range   WBC 5.2 3.4 - 10.8 x10E3/uL   RBC 4.61 3.77 - 5.28 x10E6/uL   Hemoglobin 14.0 11.1 - 15.9 g/dL   HCT 42.6 34.0 - 46.6 %   MCV 92 79 - 97 fL   MCH 30.4 26.6 - 33.0 pg   MCHC 32.9 31.5 - 35.7 g/dL   RDW 13.5 12.3 - 15.4 %   Platelets 198 150 - 379 x10E3/uL   Neutrophils Relative % 64 %   Lymphs 20 %   Monocytes 9 %   Eos 6 %   Basos 1 %   Neutrophils Absolute 3.3 1.4 - 7.0 x10E3/uL   Lymphocytes Absolute 1.0 0.7 - 3.1 x10E3/uL   Monocytes Absolute 0.5 0.1 - 0.9 x10E3/uL   Eosinophils Absolute 0.3 0.0 - 0.4 x10E3/uL   Basophils Absolute 0.0 0.0 - 0.2 x10E3/uL   Immature Granulocytes 0 %   Immature Grans (Abs) 0.0 0.0 - 0.1 x10E3/uL  Sedimentation Rate  Result Value Ref Range   Sed Rate 4 0 - 40 mm/hr  RPR  Result Value Ref Range   RPR Ser Ql Non Reactive Non Reactive  TSH  Result Value Ref Range   TSH 0.759 0.450 - 4.500 uIU/mL  ANA Comprehensive Panel  Result Value Ref Range   dsDNA Ab 1 0 - 9 IU/mL   ENA RNP Ab 0.2 0.0 - 0.9 AI   ENA SM Ab Ser-aCnc <0.2 0.0 - 0.9 AI   Scleroderma SCL-70 <0.2 0.0 - 0.9 AI   ENA SSA (RO) Ab <0.2 0.0 - 0.9 AI   ENA SSB (LA) Ab <0.2 0.0 - 0.9 AI   Chromatin Ab SerPl-aCnc <0.2 0.0 - 0.9 AI   Anti JO-1 <0.2 0.0 - 0.9 AI   Centromere Ab Screen <0.2 0.0 - 0.9  AI   See below: Comment   Rheumatoid factor  Result Value Ref Range   Rhuematoid fact SerPl-aCnc 10.7 0.0 - 13.9 IU/mL  Comprehensive metabolic panel  Result Value Ref Range   Glucose 93 65 - 99 mg/dL   BUN 10 6 - 24 mg/dL   Creatinine, Ser 0.88 0.57 - 1.00 mg/dL   GFR calc non Af Amer 77 >59 mL/min/1.73  GFR calc Af Amer 89 >59 mL/min/1.73   BUN/Creatinine Ratio 11 9 - 23   Sodium 139 134 - 144 mmol/L   Potassium 4.0 3.5 - 5.2 mmol/L   Chloride 103 97 - 108 mmol/L   CO2 21 18 - 29 mmol/L   Calcium 9.7 8.7 - 10.2 mg/dL   Total Protein 6.9 6.0 - 8.5 g/dL   Albumin 4.6 3.5 - 5.5 g/dL   Globulin, Total 2.3 1.5 - 4.5 g/dL   Albumin/Globulin Ratio 2.0 1.1 - 2.5   Bilirubin Total 0.4 0.0 - 1.2 mg/dL   Alkaline Phosphatase 65 39 - 117 IU/L   AST 24 0 - 40 IU/L   ALT 16 0 - 32 IU/L  Lyme (B. burgdorferi) PCR  Result Value Ref Range   Lyme (B. burgdorferi) PCR Negative Negative  HIV antibody (with reflex)  Result Value Ref Range   HIV Screen 4th Generation wRfx Non Reactive Non Reactive      Assessment & Plan:   Problem List Items Addressed This Visit    None    Visit Diagnoses    Encounter for screening colonoscopy    -  Primary    Relevant Orders    Ambulatory referral to Gastroenterology    Right otitis media with effusion        Relevant Medications    azithromycin (ZITHROMAX) 250 MG tablet    fluticasone (FLONASE) 50 MCG/ACT nasal spray        Follow up plan: Return if symptoms worsen or fail to improve.  Counseling provided for all of the vaccine components Orders Placed This Encounter  Procedures  . Ambulatory referral to Gastroenterology    Caryl Pina, MD [facility] 05/24/2015, 2:58 PM

## 2015-05-25 ENCOUNTER — Encounter (INDEPENDENT_AMBULATORY_CARE_PROVIDER_SITE_OTHER): Payer: Self-pay | Admitting: *Deleted

## 2015-06-26 ENCOUNTER — Other Ambulatory Visit: Payer: Self-pay

## 2015-06-26 MED ORDER — HYDROXYZINE HCL 25 MG PO TABS: ORAL_TABLET | ORAL | Status: DC

## 2015-06-26 NOTE — Telephone Encounter (Signed)
Last seen 05/24/15  Dr Dettinger  This med not on EPIC list  Sent from pharmacy

## 2015-06-29 ENCOUNTER — Other Ambulatory Visit: Payer: Self-pay | Admitting: Family Medicine

## 2015-06-29 MED ORDER — HYDROXYZINE HCL 25 MG PO TABS: ORAL_TABLET | ORAL | Status: DC

## 2015-07-02 ENCOUNTER — Telehealth: Payer: Self-pay | Admitting: *Deleted

## 2015-07-02 NOTE — Telephone Encounter (Signed)
Script for Atarax called in.

## 2015-08-06 ENCOUNTER — Encounter: Payer: Self-pay | Admitting: Family Medicine

## 2015-08-06 ENCOUNTER — Ambulatory Visit (INDEPENDENT_AMBULATORY_CARE_PROVIDER_SITE_OTHER): Payer: Medicaid Other | Admitting: Family Medicine

## 2015-08-06 VITALS — BP 126/69 | HR 70 | Temp 97.0°F | Ht 65.0 in | Wt 160.8 lb

## 2015-08-06 DIAGNOSIS — S0003XA Contusion of scalp, initial encounter: Secondary | ICD-10-CM

## 2015-08-06 NOTE — Progress Notes (Signed)
Subjective:  Patient ID: Tiffany Tyler, female    DOB: 1965-08-09  Age: 50 y.o. MRN: [id number]  CC: Head Laceration   HPI Tiffany Tyler presents for hit her head on a mantle piece that she stood up last night. It caused her to fall over but did not pass out. She says it's just because it caught her off guard. However it is sore and painful. It bled some last night that stopped spontaneously after several minutes. Additionally it bled for a short time this morning. She is concerned about exposure at work.   History Tiffany Tyler has a past medical history of Anxiety; Depression; and Aneurysm of anterior communicating artery (12/2011).   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   Her family history includes Alcohol abuse in her brother and maternal grandmother.She reports that she has been smoking.  She does not have any smokeless tobacco history on file. She reports that she does not drink alcohol or use illicit drugs.    ROS Review of Systems  Constitutional: Negative for fever, diaphoresis, activity change, appetite change and fatigue.  Gastrointestinal: Negative for nausea and vomiting.  Neurological: Negative for seizures, syncope, weakness, numbness and headaches.  Psychiatric/Behavioral: Negative for agitation.    Objective:  BP 126/69 mmHg  Pulse 70  Temp(Src) 97 F (36.1 C) (Oral)  Ht 5\' 5"  (1.651 m)  Wt 160 lb 12.8 oz (72.938 kg)  BMI 26.76 kg/m2  SpO2 97%  BP Readings from Last 3 Encounters:  08/06/15 126/69  05/24/15 117/71  02/27/15 127/77    Wt Readings from Last 3 Encounters:  08/06/15 160 lb 12.8 oz (72.938 kg)  05/24/15 173 lb (78.472 kg)  02/27/15 175 lb (79.379 kg)     Physical Exam  Constitutional: She is oriented to person, place, and time. She appears well-developed and well-nourished.  Neurological: She is alert and oriented to person, place, and time.  Skin: Skin is warm and  dry.  There is a first-degree laceration 6 mm in length by 2 mm. This is in the epidermis only. There is minimal serosanguineous discharge. No signs of infection including redness swelling or pus. It is sore to palpation.  Psychiatric: Her behavior is normal.     Lab Results  Component Value Date   WBC 5.2 07/20/2014   HGB 14.0 07/20/2014   HCT 42.6 07/20/2014   PLT 198 07/20/2014   GLUCOSE 93 07/20/2014   ALT 16 07/20/2014   AST 24 07/20/2014   NA 139 07/20/2014   K 4.0 07/20/2014   CL 103 07/20/2014   CREATININE 0.88 07/20/2014   BUN 10 07/20/2014   CO2 21 07/20/2014   TSH 0.759 07/20/2014    Mr Brain W Wo Contrast  09/26/2014  CLINICAL DATA:  History of aneurysm.  Blurred vision. EXAM: MRI HEAD WITHOUT AND WITH CONTRAST TECHNIQUE: Multiplanar, multiecho pulse sequences of the brain and surrounding structures were obtained without and with intravenous contrast. CONTRAST:  15 mL MultiHance IV COMPARISON:  None. FINDINGS: Ventricle size is normal. Cerebral volume is normal. Pituitary normal in size. Craniocervical junction normal. Negative for acute ischemia. Patchy hyperintensity in the deep white matter of both cerebral hemispheres most consistent with chronic microvascular ischemia. No cortical infarct. Brainstem and cerebellum normal. Negative for intracranial hemorrhage Negative for mass or edema. Normal enhancement following contrast infusion History of cerebral aneurysm repair. No prior CT or records are available to determine the site of prior aneurysm. If there is concern  of recurrent aneurysm, MRA or CTA recommended for further evaluation Mucosal edema in the paranasal sinuses bilaterally. Right mastoid sinus effusion. IMPRESSION: Chronic microvascular ischemic change in the cerebral white matter bilaterally. No acute abnormality. History of prior aneurysm repair, not well visualized on MRI. Electronically Signed   By: Franchot Gallo M.D.   On: 09/26/2014 14:12    Assessment &  Plan:   [name] was seen today for head laceration.  Diagnoses and all orders for this visit:  Scalp contusion, initial encounter   Wound care reviewed. Signs and symptoms of infection. Laceration care handout printed  I have discontinued [name]'s azithromycin. I am also having her maintain her topiramate, ferrous sulfate, omeprazole, citalopram, fluticasone, and hydrOXYzine.  No orders of the defined types were placed in this encounter.     Follow-up: Return if symptoms worsen or fail to improve. Follow up immediately for nausea vomiting or intense headache.  Claretta Fraise, M.D.

## 2015-08-06 NOTE — Patient Instructions (Signed)

## 2015-09-06 ENCOUNTER — Encounter (INDEPENDENT_AMBULATORY_CARE_PROVIDER_SITE_OTHER): Payer: Self-pay | Admitting: *Deleted

## 2015-09-07 ENCOUNTER — Other Ambulatory Visit (INDEPENDENT_AMBULATORY_CARE_PROVIDER_SITE_OTHER): Payer: Self-pay | Admitting: *Deleted

## 2015-09-07 DIAGNOSIS — Z1211 Encounter for screening for malignant neoplasm of colon: Secondary | ICD-10-CM

## 2015-09-07 DIAGNOSIS — Z800 Family history of malignant neoplasm of digestive organs: Secondary | ICD-10-CM

## 2015-09-11 ENCOUNTER — Other Ambulatory Visit: Payer: Self-pay | Admitting: Nurse Practitioner

## 2015-09-21 ENCOUNTER — Other Ambulatory Visit (INDEPENDENT_AMBULATORY_CARE_PROVIDER_SITE_OTHER): Payer: Self-pay | Admitting: *Deleted

## 2015-09-21 NOTE — Telephone Encounter (Signed)
trilyte

## 2015-09-24 MED ORDER — PEG 3350-KCL-NA BICARB-NACL 420 G PO SOLR: 4000.0000 mL | Freq: Once | ORAL | Status: DC

## 2015-10-05 ENCOUNTER — Telehealth (INDEPENDENT_AMBULATORY_CARE_PROVIDER_SITE_OTHER): Payer: Self-pay | Admitting: *Deleted

## 2015-10-05 NOTE — Telephone Encounter (Signed)
Referring MD/PCP: stacks -- [facility]   Procedure: tcs  Reason/Indication:  Screening, fam hx colon ca  Has patient had this procedure before?  no  If so, when, by whom and where?    Is there a family history of colon cancer?  Yes, mother  Who?  What age when diagnosed?    Is patient diabetic?   no      Does patient have prosthetic heart valve or mechanical valve?  no  Do you have a pacemaker?  no  Has patient ever had endocarditis? no  Has patient had joint replacement within last 12 months?  no  Does patient tend to be constipated or take laxatives? no  Does patient have a history of alcohol/drug use?  no  Is patient on Coumadin, Plavix and/or Aspirin? no  Medications: celexa 40 mg daily, iron 325 mg daily, omeprazole 20 mg daily, topiramate  Allergies: see epic  Medication Adjustment: iron 10 days  Procedure date & time: 11/01/15 at 1200

## 2015-10-09 NOTE — Telephone Encounter (Signed)
agree

## 2015-12-19 ENCOUNTER — Encounter (INDEPENDENT_AMBULATORY_CARE_PROVIDER_SITE_OTHER): Payer: Self-pay | Admitting: *Deleted

## 2015-12-21 ENCOUNTER — Encounter: Payer: Self-pay | Admitting: Physician Assistant

## 2015-12-21 ENCOUNTER — Ambulatory Visit (INDEPENDENT_AMBULATORY_CARE_PROVIDER_SITE_OTHER): Payer: Medicaid Other | Admitting: Physician Assistant

## 2015-12-21 VITALS — BP 124/84 | HR 80 | Temp 97.4°F | Ht 65.0 in | Wt 162.4 lb

## 2015-12-21 DIAGNOSIS — S5012XA Contusion of left forearm, initial encounter: Secondary | ICD-10-CM

## 2015-12-21 DIAGNOSIS — S5011XA Contusion of right forearm, initial encounter: Secondary | ICD-10-CM

## 2015-12-21 MED ORDER — IBUPROFEN 600 MG PO TABS: 600.0000 mg | ORAL_TABLET | Freq: Three times a day (TID) | ORAL | Status: DC | PRN

## 2015-12-21 NOTE — Patient Instructions (Signed)

## 2015-12-21 NOTE — Progress Notes (Signed)
Subjective:     Patient ID: Tiffany Tyler, female   DOB: April 10, 1966, 50 y.o.   MRN: [id number]  HPI Pt with some red areas to the lower L arm  Review of Systems No pain or swelling No drainage No meds for the sx    Objective:   Physical Exam Several area of superficial bruising to the L mid forearm area No surrounding edema, erythema, or induration No TTP FROM elbow/wrist No skin breakdown noted    Assessment:     1. Contusion of lower arm, left, initial encounter        Plan:     Nl course reviewed with pt Activity as tol She also wanted a rx for Ibuprofen for intermit LBP  This has worked well in the past Ibuprofen rx done  F/U prn

## 2016-01-02 ENCOUNTER — Telehealth (INDEPENDENT_AMBULATORY_CARE_PROVIDER_SITE_OTHER): Payer: Self-pay | Admitting: *Deleted

## 2016-01-02 NOTE — Telephone Encounter (Signed)
agree

## 2016-01-02 NOTE — Telephone Encounter (Signed)
Referring MD/PCP: stacks--[facility]   Procedure: tcs  Reason/Indication:  Screening, fam hx colon ca  Has patient had this procedure before?  no  If so, when, by whom and where?    Is there a family history of colon cancer?  Yes, mother  Who?  What age when diagnosed?    Is patient diabetic?   no      Does patient have prosthetic heart valve or mechanical valve?  no  Do you have a pacemaker?  no  Has patient ever had endocarditis? no  Has patient had joint replacement within last 12 months?  no  Does patient tend to be constipated or take laxatives? o  Does patient have a history of alcohol/drug use?  no  Is patient on Coumadin, Plavix and/or Aspirin? no  Medications: celexa 40 mg daily, iron 325 mg daily, omeprazole 20 mg daily,   Allergies: see epic  Medication Adjustment: iron 10 days  Procedure date & time: 01/31/16 at 930

## 2016-01-03 ENCOUNTER — Other Ambulatory Visit: Payer: Medicaid Other | Admitting: Pediatrics

## 2016-01-04 ENCOUNTER — Encounter: Payer: Self-pay | Admitting: Family Medicine

## 2016-01-07 ENCOUNTER — Telehealth: Payer: Self-pay | Admitting: Family Medicine

## 2016-01-16 ENCOUNTER — Other Ambulatory Visit: Payer: Medicaid Other | Admitting: Pediatrics

## 2016-01-23 ENCOUNTER — Other Ambulatory Visit: Payer: Medicaid Other | Admitting: Pediatrics

## 2016-01-24 ENCOUNTER — Encounter: Payer: Self-pay | Admitting: Family Medicine

## 2016-01-31 SURGERY — COLONOSCOPY
Anesthesia: Moderate Sedation

## 2016-02-01 ENCOUNTER — Ambulatory Visit (HOSPITAL_COMMUNITY): Admission: RE | Admit: 2016-02-01 | Payer: Medicaid Other | Source: Ambulatory Visit | Admitting: Internal Medicine

## 2016-05-07 ENCOUNTER — Other Ambulatory Visit: Payer: Self-pay | Admitting: Nurse Practitioner

## 2016-05-21 ENCOUNTER — Other Ambulatory Visit: Payer: Medicaid Other | Admitting: Pediatrics

## 2016-06-26 ENCOUNTER — Other Ambulatory Visit: Payer: Self-pay | Admitting: Family Medicine

## 2016-07-23 ENCOUNTER — Encounter: Payer: Self-pay | Admitting: Family Medicine

## 2016-07-23 ENCOUNTER — Ambulatory Visit (INDEPENDENT_AMBULATORY_CARE_PROVIDER_SITE_OTHER): Payer: Medicaid Other | Admitting: Family Medicine

## 2016-07-23 VITALS — BP 109/64 | HR 60 | Temp 98.1°F | Ht 65.0 in | Wt 175.0 lb

## 2016-07-23 DIAGNOSIS — Z0000 Encounter for general adult medical examination without abnormal findings: Secondary | ICD-10-CM | POA: Diagnosis not present

## 2016-07-23 DIAGNOSIS — Z124 Encounter for screening for malignant neoplasm of cervix: Secondary | ICD-10-CM

## 2016-07-23 DIAGNOSIS — Z202 Contact with and (suspected) exposure to infections with a predominantly sexual mode of transmission: Secondary | ICD-10-CM

## 2016-07-23 DIAGNOSIS — Z01411 Encounter for gynecological examination (general) (routine) with abnormal findings: Secondary | ICD-10-CM

## 2016-07-23 DIAGNOSIS — Z1211 Encounter for screening for malignant neoplasm of colon: Secondary | ICD-10-CM

## 2016-07-23 LAB — MICROSCOPIC EXAMINATION: RENAL EPITHEL UA: NONE SEEN /HPF

## 2016-07-23 LAB — URINALYSIS, COMPLETE
Bilirubin, UA: NEGATIVE
Glucose, UA: NEGATIVE
KETONES UA: NEGATIVE
Nitrite, UA: NEGATIVE
PH UA: 7 (ref 5.0–7.5)
PROTEIN UA: NEGATIVE
RBC, UA: NEGATIVE
SPEC GRAV UA: 1.01 (ref 1.005–1.030)
Urobilinogen, Ur: 0.2 mg/dL (ref 0.2–1.0)

## 2016-07-23 MED ORDER — FERROUS SULFATE 325 (65 FE) MG PO TABS: 325.0000 mg | ORAL_TABLET | Freq: Every day | ORAL | 5 refills | Status: AC

## 2016-07-23 MED ORDER — CITALOPRAM HYDROBROMIDE 40 MG PO TABS: 40.0000 mg | ORAL_TABLET | Freq: Every day | ORAL | 5 refills | Status: DC

## 2016-07-23 MED ORDER — IBUPROFEN 600 MG PO TABS: 600.0000 mg | ORAL_TABLET | Freq: Three times a day (TID) | ORAL | 0 refills | Status: AC | PRN

## 2016-07-23 MED ORDER — HYDROXYZINE HCL 25 MG PO TABS: ORAL_TABLET | ORAL | 5 refills | Status: AC

## 2016-07-23 MED ORDER — TOPIRAMATE 100 MG PO TABS: 150.0000 mg | ORAL_TABLET | Freq: Two times a day (BID) | ORAL | 2 refills | Status: DC
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
## 2016-07-24 LAB — CBC WITH DIFFERENTIAL/PLATELET
Basophils Absolute: 0 10*3/uL (ref 0.0–0.2)
Basos: 0 %
EOS (ABSOLUTE): 0.4 10*3/uL (ref 0.0–0.4)
Eos: 8 %
HEMATOCRIT: 41.7 % (ref 34.0–46.6)
HEMOGLOBIN: 13.7 g/dL (ref 11.1–15.9)
IMMATURE GRANULOCYTES: 0 %
Immature Grans (Abs): 0 10*3/uL (ref 0.0–0.1)
LYMPHS ABS: 1.6 10*3/uL (ref 0.7–3.1)
Lymphs: 32 %
MCH: 30.4 pg (ref 26.6–33.0)
MCHC: 32.9 g/dL (ref 31.5–35.7)
MCV: 93 fL (ref 79–97)
MONOCYTES: 5 %
Monocytes Absolute: 0.2 10*3/uL (ref 0.1–0.9)
NEUTROS PCT: 55 %
Neutrophils Absolute: 2.9 10*3/uL (ref 1.4–7.0)
Platelets: 184 10*3/uL (ref 150–379)
RBC: 4.5 x10E6/uL (ref 3.77–5.28)
RDW: 13.5 % (ref 12.3–15.4)
WBC: 5.1 10*3/uL (ref 3.4–10.8)

## 2016-07-24 LAB — LIPID PANEL
CHOL/HDL RATIO: 2.7 ratio (ref 0.0–4.4)
Cholesterol, Total: 189 mg/dL (ref 100–199)
HDL: 71 mg/dL (ref >39)
LDL CALC: 98 mg/dL (ref 0–99)
Triglycerides: 99 mg/dL (ref 0–149)
VLDL Cholesterol Cal: 20 mg/dL (ref 5–40)

## 2016-07-24 LAB — CMP14+EGFR
ALBUMIN: 4.1 g/dL (ref 3.5–5.5)
ALT: 15 IU/L (ref 0–32)
AST: 21 IU/L (ref 0–40)
Albumin/Globulin Ratio: 1.8 (ref 1.2–2.2)
Alkaline Phosphatase: 66 IU/L (ref 39–117)
BUN / CREAT RATIO: 15 (ref 9–23)
BUN: 14 mg/dL (ref 6–24)
Bilirubin Total: 0.3 mg/dL (ref 0.0–1.2)
CO2: 24 mmol/L (ref 18–29)
CREATININE: 0.91 mg/dL (ref 0.57–1.00)
Calcium: 9 mg/dL (ref 8.7–10.2)
Chloride: 107 mmol/L — ABNORMAL HIGH (ref 96–106)
GFR calc Af Amer: 84 mL/min/{1.73_m2} (ref >59)
GFR, EST NON AFRICAN AMERICAN: 73 mL/min/{1.73_m2} (ref >59)
GLOBULIN, TOTAL: 2.3 g/dL (ref 1.5–4.5)
Glucose: 70 mg/dL (ref 65–99)
Potassium: 3.5 mmol/L (ref 3.5–5.2)
SODIUM: 144 mmol/L (ref 134–144)
Total Protein: 6.4 g/dL (ref 6.0–8.5)

## 2016-07-24 LAB — RPR: RPR Ser Ql: NONREACTIVE

## 2016-07-24 LAB — TSH: TSH: 0.948 u[IU]/mL (ref 0.450–4.500)

## 2016-07-24 LAB — HEPATITIS C ANTIBODY

## 2016-07-24 LAB — HIV ANTIBODY (ROUTINE TESTING W REFLEX): HIV Screen 4th Generation wRfx: NONREACTIVE

## 2016-07-25 LAB — PAP IG, CT-NG NAA, HPV HIGH-RISK
CHLAMYDIA, NUC. ACID AMP: NEGATIVE
GONOCOCCUS BY NUCLEIC ACID AMP: NEGATIVE
HPV, high-risk: NEGATIVE
PAP Smear Comment: 0

## 2016-07-26 ENCOUNTER — Other Ambulatory Visit: Payer: Self-pay | Admitting: Family Medicine

## 2016-07-26 MED ORDER — METRONIDAZOLE 500 MG PO TABS: 2000.0000 mg | ORAL_TABLET | Freq: Once | ORAL | 0 refills | Status: AC

## 2016-07-26 MED ORDER — AMOXICILLIN-POT CLAVULANATE 875-125 MG PO TABS: 1.0000 | ORAL_TABLET | Freq: Two times a day (BID) | ORAL | 0 refills | Status: AC

## 2016-07-29 ENCOUNTER — Encounter (INDEPENDENT_AMBULATORY_CARE_PROVIDER_SITE_OTHER): Payer: Self-pay | Admitting: *Deleted

## 2016-10-03 ENCOUNTER — Ambulatory Visit: Payer: Medicaid Other | Admitting: Family Medicine

## 2016-10-06 ENCOUNTER — Encounter: Payer: Self-pay | Admitting: Family Medicine

## 2016-12-10 ENCOUNTER — Encounter: Payer: Medicaid Other | Admitting: *Deleted

## 2017-01-22 DIAGNOSIS — Z171 Estrogen receptor negative status [ER-]: Secondary | ICD-10-CM

## 2017-01-22 DIAGNOSIS — C50811 Malignant neoplasm of overlapping sites of right female breast: Secondary | ICD-10-CM | POA: Insufficient documentation

## 2017-03-21 IMAGING — MR MR HEAD WO/W CM
10 of 12 series · 34 of 48 positions shown · IV contrast (multihance)
Comparison: None.

CLINICAL DATA: History of aneurysm.  Blurred vision.

EXAM:
MRI HEAD WITHOUT AND WITH CONTRAST
TECHNIQUE: Multiplanar, multiecho pulse sequences of the brain and surrounding
structures were obtained without and with intravenous contrast.
CONTRAST:  15 mL MultiHance IV

[Series 3: T1 · sagittal · 5.0mm · 0.47mm/px · 3 of 23 slices shown]
[im 1/23]
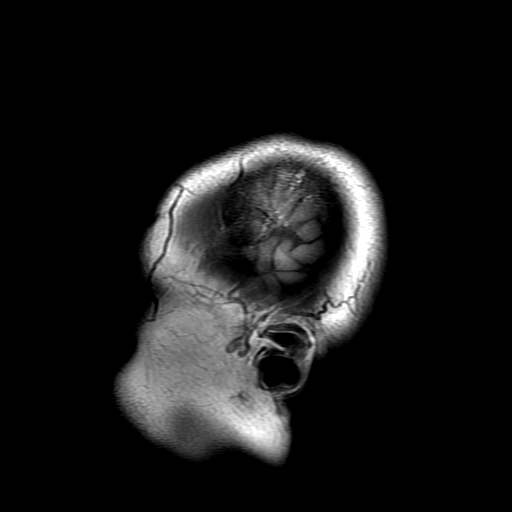
[im 12/23]
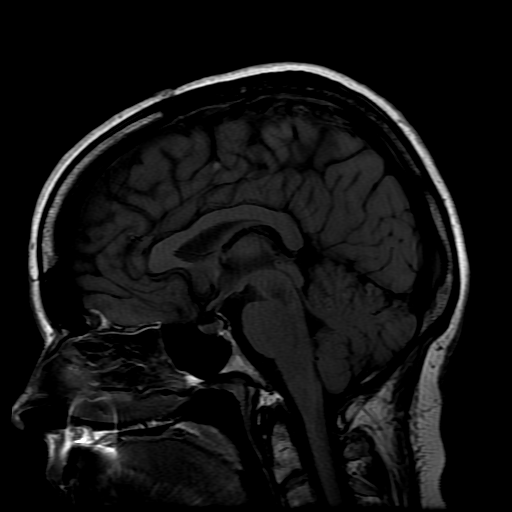
[im 23/23]
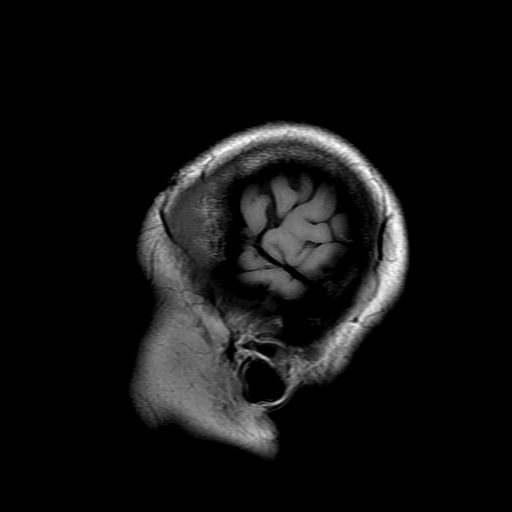

[Series 4: DWI · axial · 3.0mm · 1.09mm/px · z∈[-60,+82]mm · 9 of 98 slices shown (1 of 4)]
[im 1/98]
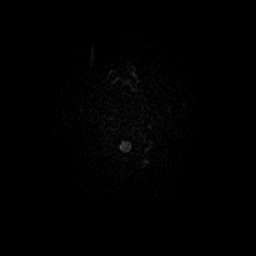
[im 13/98]
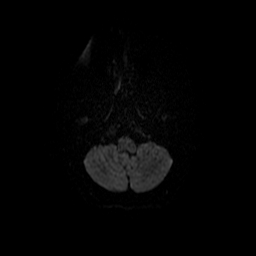
[im 25/98]
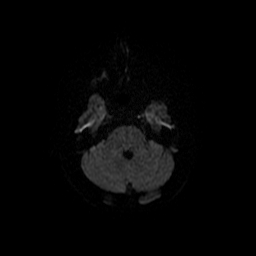
[im 37/98]
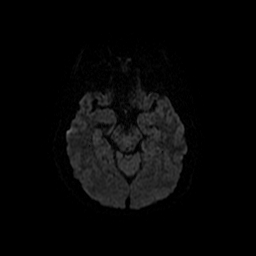
[im 49/98]
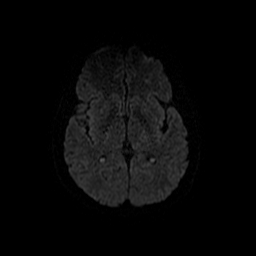
[im 61/98]
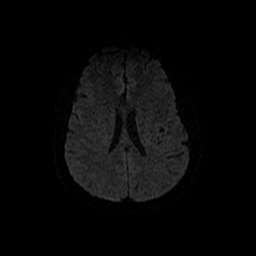
[im 73/98]
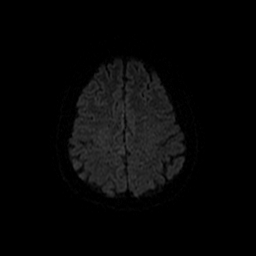
[im 85/98]
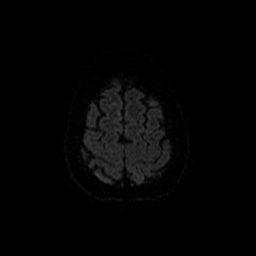
[im 98/98]
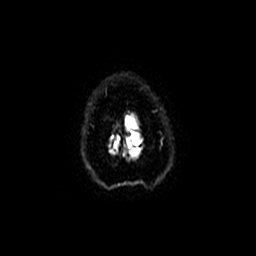

[Series 5: T2 · axial · 5.0mm · 0.43mm/px · z∈[-62,+92]mm · 2 of 27 slices shown]
[im 1/27]
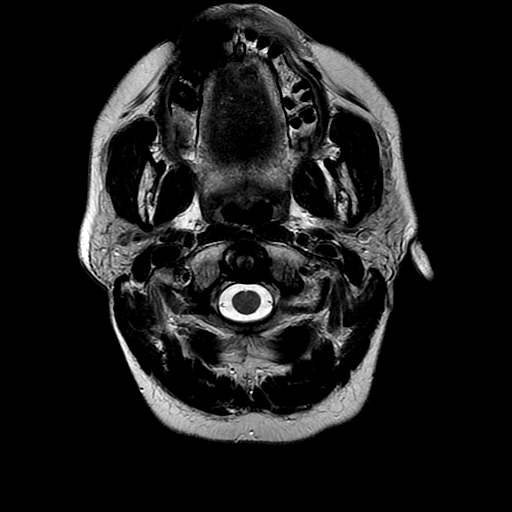
[im 27/27]
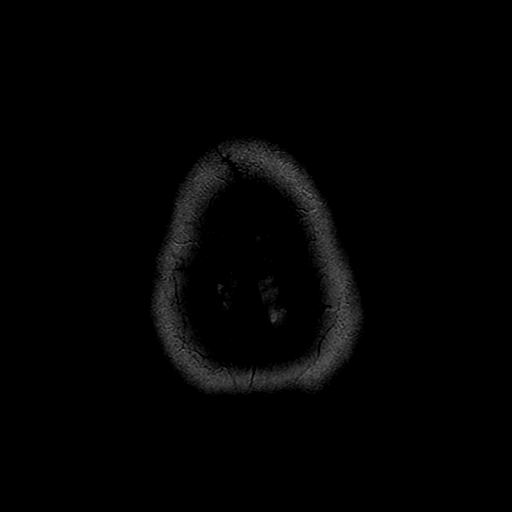

[Series 6: DWI · coronal · 5.0mm · 1.09mm/px · 6 of 70 slices shown (2 of 4)]
[im 1/70]
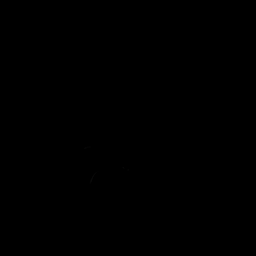
[im 14/70]
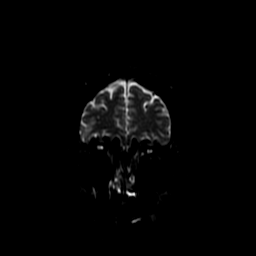
[im 28/70]
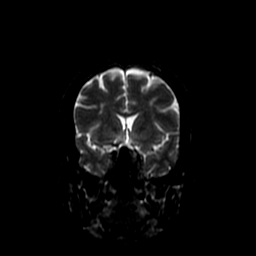
[im 42/70]
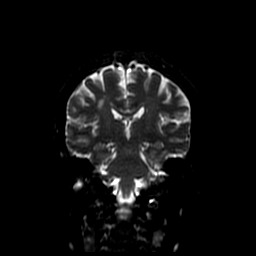
[im 56/70]
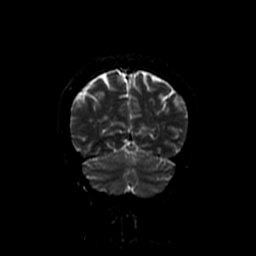
[im 70/70]
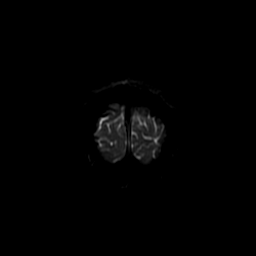

[Series 7: FLAIR · axial · 5.0mm · 0.43mm/px · z∈[-67,+84]mm · 2 of 23 slices shown]
[im 1/23]
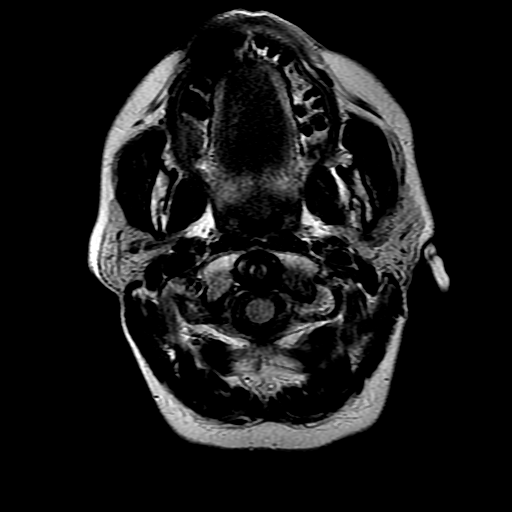
[im 23/23]
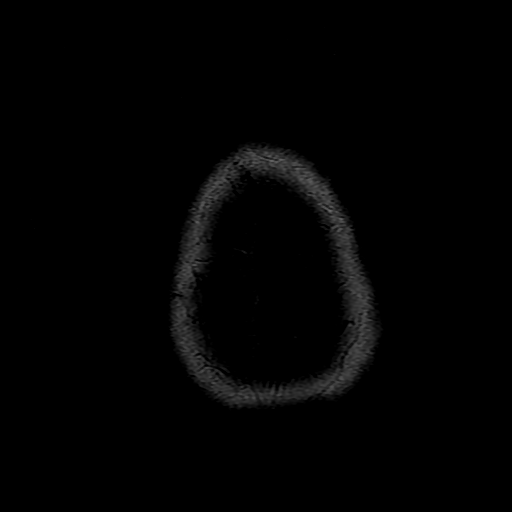

[Series 8: ax mpgr · axial · 5.0mm · 0.45mm/px · 1 of 23 slices shown]
[im 1/23]
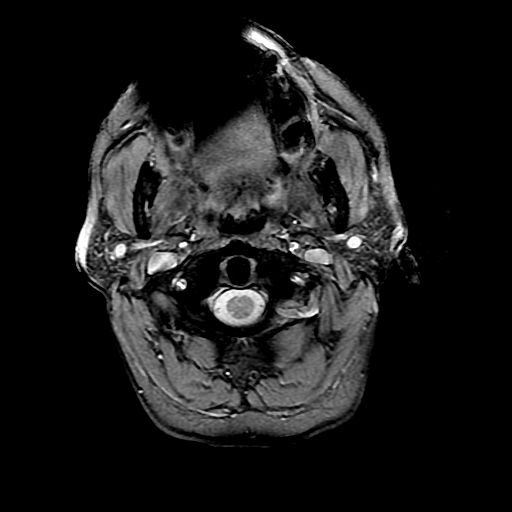

[Series 11: T2 post-contrast · coronal · 5.0mm · 0.39mm/px · 2 of 27 slices shown]
[im 1/27]
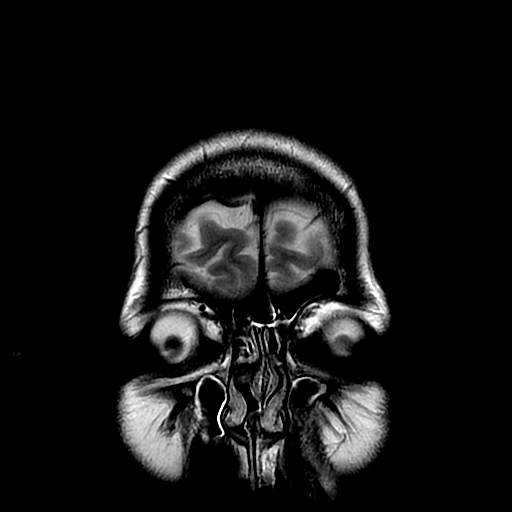
[im 27/27]
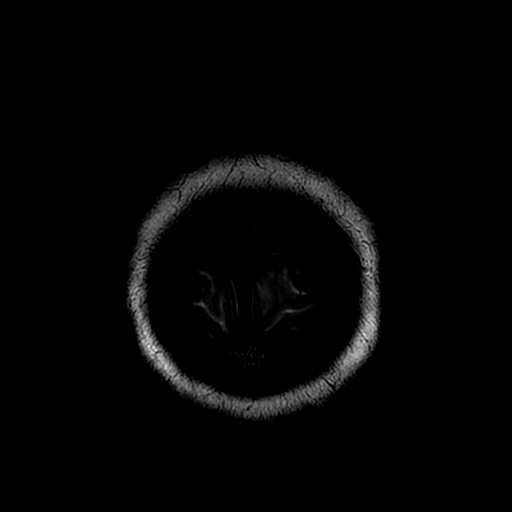

[Series 13: T1 post-contrast · coronal · 5.0mm · 0.39mm/px · 2 of 27 slices shown]
[im 1/27]
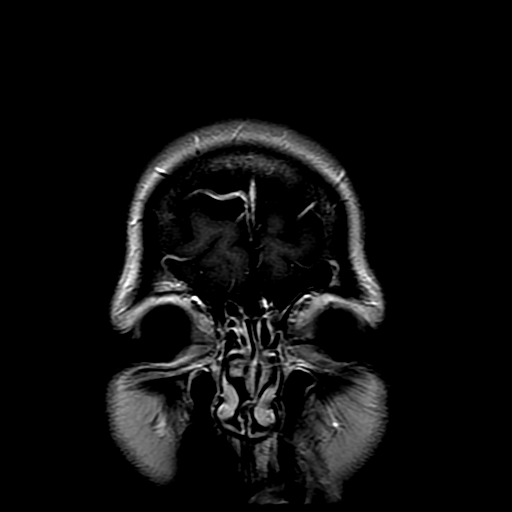
[im 27/27]
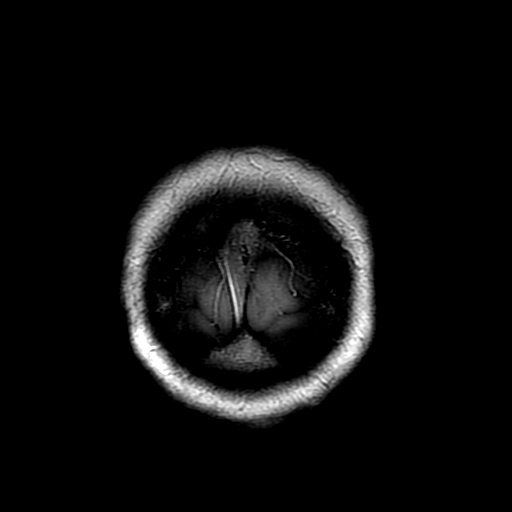

[Series 400: DWI · axial · 3.0mm · 1.09mm/px · z∈[-60,+82]mm · 4 of 49 slices shown (3 of 4)]
[im 1/49]
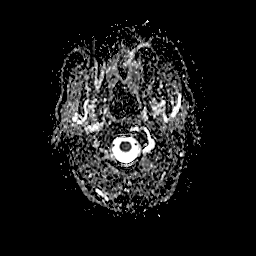
[im 17/49]
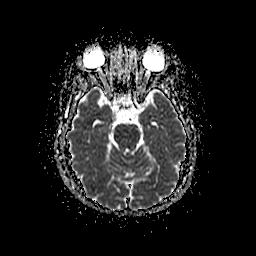
[im 33/49]
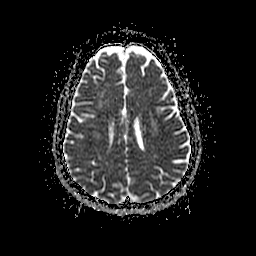
[im 49/49]
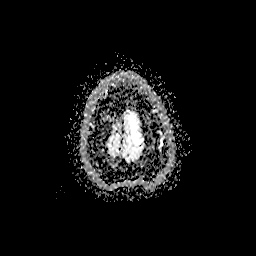

[Series 600: DWI · coronal · 5.0mm · 1.09mm/px · 3 of 35 slices shown (4 of 4)]
[im 1/35]
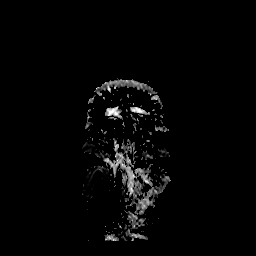
[im 18/35]
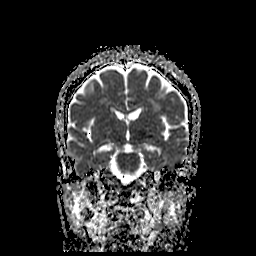
[im 35/35]
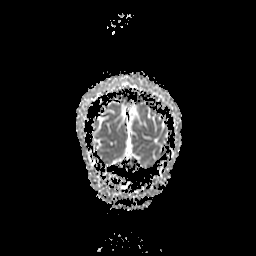

[34 of 48 positions shown; findings below may reference images not displayed]

FINDINGS: Ventricle size is normal. Cerebral volume is normal. Pituitary
normal in size. Craniocervical junction normal.

Negative for acute ischemia. Patchy hyperintensity in the deep white
matter of both cerebral hemispheres most consistent with chronic
microvascular ischemia. No cortical infarct. Brainstem and
cerebellum normal.

Negative for intracranial hemorrhage

Negative for mass or edema.

Normal enhancement following contrast infusion

History of cerebral aneurysm repair. No prior CT or records are
available to determine the site of prior aneurysm. If there is
concern of recurrent aneurysm, MRA or CTA recommended for further
evaluation

Mucosal edema in the paranasal sinuses bilaterally. Right mastoid
sinus effusion.
IMPRESSION: Chronic microvascular ischemic change in the cerebral white matter
bilaterally. No acute abnormality.

History of prior aneurysm repair, not well visualized on MRI.

## 2017-11-03 DIAGNOSIS — M7918 Myalgia, other site: Secondary | ICD-10-CM | POA: Insufficient documentation

## 2017-11-03 DIAGNOSIS — G8929 Other chronic pain: Secondary | ICD-10-CM | POA: Insufficient documentation

## 2017-11-12 ENCOUNTER — Encounter (HOSPITAL_COMMUNITY): Payer: Self-pay | Admitting: Psychiatry

## 2017-11-12 ENCOUNTER — Ambulatory Visit (INDEPENDENT_AMBULATORY_CARE_PROVIDER_SITE_OTHER): Payer: Medicaid Other | Admitting: Psychiatry

## 2017-11-12 ENCOUNTER — Other Ambulatory Visit: Payer: Self-pay

## 2017-11-12 VITALS — BP 130/84 | HR 75 | Ht 62.0 in | Wt 172.0 lb

## 2017-11-12 DIAGNOSIS — F411 Generalized anxiety disorder: Secondary | ICD-10-CM

## 2017-11-12 DIAGNOSIS — F3130 Bipolar disorder, current episode depressed, mild or moderate severity, unspecified: Secondary | ICD-10-CM | POA: Diagnosis not present

## 2017-11-12 DIAGNOSIS — F1021 Alcohol dependence, in remission: Secondary | ICD-10-CM

## 2017-11-12 DIAGNOSIS — F419 Anxiety disorder, unspecified: Secondary | ICD-10-CM | POA: Diagnosis not present

## 2017-11-12 DIAGNOSIS — F0630 Mood disorder due to known physiological condition, unspecified: Secondary | ICD-10-CM | POA: Diagnosis not present

## 2017-11-12 DIAGNOSIS — Z811 Family history of alcohol abuse and dependence: Secondary | ICD-10-CM

## 2017-11-12 DIAGNOSIS — Z62810 Personal history of physical and sexual abuse in childhood: Secondary | ICD-10-CM

## 2017-11-12 DIAGNOSIS — F17210 Nicotine dependence, cigarettes, uncomplicated: Secondary | ICD-10-CM

## 2017-11-12 MED ORDER — ARIPIPRAZOLE 5 MG PO TABS: 5.0000 mg | ORAL_TABLET | Freq: Every day | ORAL | 0 refills | Status: AC

## 2017-11-12 MED ORDER — CITALOPRAM HYDROBROMIDE 20 MG PO TABS: 20.0000 mg | ORAL_TABLET | Freq: Every day | ORAL | 0 refills | Status: AC
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
## 2017-12-14 ENCOUNTER — Ambulatory Visit (HOSPITAL_COMMUNITY): Payer: Medicaid Other | Admitting: Psychiatry

## 2017-12-15 ENCOUNTER — Ambulatory Visit (HOSPITAL_COMMUNITY): Payer: Medicaid Other | Admitting: Licensed Clinical Social Worker

## 2017-12-25 ENCOUNTER — Ambulatory Visit (HOSPITAL_COMMUNITY): Payer: Medicaid Other | Admitting: Psychiatry
